# Patient Record
Sex: Female | Born: 1975 | Hispanic: Yes | Smoking: Current every day smoker
Health system: Southern US, Community
[De-identification: ages and names within clinical notes are randomized; demographics above are authoritative.]

## PROBLEM LIST (undated history)

## (undated) DIAGNOSIS — F32A Depression, unspecified: Secondary | ICD-10-CM

## (undated) DIAGNOSIS — N939 Abnormal uterine and vaginal bleeding, unspecified: Secondary | ICD-10-CM

## (undated) DIAGNOSIS — R638 Other symptoms and signs concerning food and fluid intake: Secondary | ICD-10-CM

## (undated) DIAGNOSIS — N920 Excessive and frequent menstruation with regular cycle: Secondary | ICD-10-CM

## (undated) DIAGNOSIS — N889 Noninflammatory disorder of cervix uteri, unspecified: Secondary | ICD-10-CM

## (undated) DIAGNOSIS — A4902 Methicillin resistant Staphylococcus aureus infection, unspecified site: Secondary | ICD-10-CM

## (undated) DIAGNOSIS — F329 Major depressive disorder, single episode, unspecified: Secondary | ICD-10-CM

## (undated) DIAGNOSIS — M2550 Pain in unspecified joint: Secondary | ICD-10-CM

## (undated) DIAGNOSIS — F419 Anxiety disorder, unspecified: Secondary | ICD-10-CM

## (undated) DIAGNOSIS — S52209A Unspecified fracture of shaft of unspecified ulna, initial encounter for closed fracture: Secondary | ICD-10-CM

## (undated) DIAGNOSIS — F4310 Post-traumatic stress disorder, unspecified: Secondary | ICD-10-CM

## (undated) DIAGNOSIS — R7303 Prediabetes: Secondary | ICD-10-CM

## (undated) HISTORY — DX: Other symptoms and signs concerning food and fluid intake: R63.8

## (undated) HISTORY — DX: Depression, unspecified: F32.A

## (undated) HISTORY — DX: Prediabetes: R73.03

## (undated) HISTORY — DX: Major depressive disorder, single episode, unspecified: F32.9

## (undated) HISTORY — DX: Anxiety disorder, unspecified: F41.9

## (undated) HISTORY — DX: Unspecified fracture of shaft of unspecified ulna, initial encounter for closed fracture: S52.209A

## (undated) HISTORY — DX: Noninflammatory disorder of cervix uteri, unspecified: N88.9

## (undated) HISTORY — DX: Pain in unspecified joint: M25.50

## (undated) HISTORY — DX: Methicillin resistant Staphylococcus aureus infection, unspecified site: A49.02

## (undated) HISTORY — DX: Post-traumatic stress disorder, unspecified: F43.10

## (undated) HISTORY — DX: Abnormal uterine and vaginal bleeding, unspecified: N93.9

## (undated) HISTORY — DX: Excessive and frequent menstruation with regular cycle: N92.0

---

## 1997-02-20 HISTORY — PX: CYST EXCISION: SHX5701

## 2000-01-20 ENCOUNTER — Inpatient Hospital Stay (HOSPITAL_COMMUNITY): Admission: AD | Admit: 2000-01-20 | Discharge: 2000-01-20 | Payer: Self-pay | Admitting: Obstetrics & Gynecology

## 2004-02-21 HISTORY — PX: CHOLECYSTECTOMY: SHX55

## 2004-08-04 ENCOUNTER — Ambulatory Visit: Payer: Self-pay | Admitting: Family Medicine

## 2004-08-11 ENCOUNTER — Ambulatory Visit: Payer: Self-pay | Admitting: Internal Medicine

## 2004-08-20 ENCOUNTER — Ambulatory Visit: Payer: Self-pay | Admitting: Internal Medicine

## 2004-09-26 ENCOUNTER — Inpatient Hospital Stay: Payer: Self-pay | Admitting: General Surgery

## 2005-02-25 ENCOUNTER — Emergency Department: Payer: Self-pay | Admitting: Emergency Medicine

## 2005-02-27 ENCOUNTER — Ambulatory Visit: Payer: Self-pay | Admitting: Internal Medicine

## 2005-03-23 ENCOUNTER — Ambulatory Visit: Payer: Self-pay | Admitting: Internal Medicine

## 2009-05-07 ENCOUNTER — Emergency Department (HOSPITAL_COMMUNITY): Admission: EM | Admit: 2009-05-07 | Discharge: 2009-05-07 | Payer: Self-pay | Admitting: Family Medicine

## 2009-05-21 ENCOUNTER — Emergency Department (HOSPITAL_COMMUNITY): Admission: EM | Admit: 2009-05-21 | Discharge: 2009-05-21 | Payer: Self-pay | Admitting: Family Medicine

## 2010-09-24 ENCOUNTER — Inpatient Hospital Stay (INDEPENDENT_AMBULATORY_CARE_PROVIDER_SITE_OTHER)
Admission: RE | Admit: 2010-09-24 | Discharge: 2010-09-24 | Disposition: A | Discharge status: Home / Self Care | Payer: BC Managed Care – PPO | Source: Ambulatory Visit | Attending: Emergency Medicine | Admitting: Emergency Medicine

## 2010-09-24 DIAGNOSIS — J029 Acute pharyngitis, unspecified: Secondary | ICD-10-CM

## 2010-09-24 LAB — POCT RAPID STREP A: Streptococcus, Group A Screen (Direct): NEGATIVE

## 2010-09-25 LAB — STREP A DNA PROBE: Group A Strep Probe: NEGATIVE

## 2011-03-24 DIAGNOSIS — 419620001 Death: Secondary | SNOMED CT

## 2011-03-24 DEATH — deceased

## 2011-05-09 DIAGNOSIS — O24419 Gestational diabetes mellitus in pregnancy, unspecified control: Secondary | ICD-10-CM | POA: Insufficient documentation

## 2013-02-20 HISTORY — PX: BREAST CYST ASPIRATION: SHX578

## 2013-11-20 DIAGNOSIS — A4902 Methicillin resistant Staphylococcus aureus infection, unspecified site: Secondary | ICD-10-CM

## 2013-11-20 HISTORY — DX: Methicillin resistant Staphylococcus aureus infection, unspecified site: A49.02

## 2013-11-20 LAB — HM MAMMOGRAPHY

## 2013-12-03 ENCOUNTER — Encounter: Payer: Self-pay | Admitting: General Surgery

## 2013-12-03 ENCOUNTER — Ambulatory Visit: Payer: Self-pay

## 2013-12-09 ENCOUNTER — Encounter: Payer: Self-pay | Admitting: General Surgery

## 2013-12-09 ENCOUNTER — Ambulatory Visit (INDEPENDENT_AMBULATORY_CARE_PROVIDER_SITE_OTHER): Payer: Managed Care, Other (non HMO) | Admitting: General Surgery

## 2013-12-09 VITALS — BP 130/84 | HR 74 | Resp 14 | Ht 66.0 in | Wt 242.0 lb

## 2013-12-09 DIAGNOSIS — L02412 Cutaneous abscess of left axilla: Secondary | ICD-10-CM

## 2013-12-09 MED ORDER — DOXYCYCLINE HYCLATE 100 MG PO CAPS: 100.0000 mg | ORAL_CAPSULE | Freq: Two times a day (BID) | ORAL | Status: DC

## 2013-12-09 NOTE — Patient Instructions (Signed)
The patient is aware to call back for any questions or concerns.  

## 2013-12-09 NOTE — Progress Notes (Signed)
Patient ID: Dana Durham, female   DOB: 03-03-1975, 38 y.o.   MRN: [id number]  Chief Complaint  Patient presents with  . Other    lump under left arm pit    HPI Dana Livoni C Leclaire is a 38 y.o. female.  Here for evaluation of a knot under her left axilla. She states it has been there about a month. It seems to have gotten larger in size. She noticed it while shaving. Denies any pain. No antibiotic therapy. She states it did drain an small amount of red drainage for a few days but is not draining now. Denies any breast issues. Denies any upper respiratory issues. 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   HPI  Past Medical History  Diagnosis Date  . Joint pain   . Depression   . PTSD (post-traumatic stress disorder)   . Cervix abnormality     Past Surgical History  Procedure Laterality Date  . Cyst excision  1999    eyelid  . Cholecystectomy  2006    Family History  Problem Relation Age of Onset  . Cancer Maternal Aunt     breast/Aunt  . Cancer Cousin     breast/Maternal    Social History History  Substance Use Topics  . Smoking status: Current Every Day Smoker -- 1.00 packs/day for 19 years  . Smokeless tobacco: Never Used  . Alcohol Use: No    Allergies  Allergen Reactions  . Shellfish Allergy Anaphylaxis    Current Outpatient Prescriptions  Medication Sig Dispense Refill  . ALPRAZolam (XANAX) 1 MG tablet Take 1 mg by mouth 3 (three) times daily as needed for anxiety.      . Multiple Vitamin (MULTIVITAMIN) capsule Take 1 capsule by mouth daily.      . Omega-3 Fatty Acids (FISH OIL) 600 MG CAPS Take by mouth daily.      Marland Kitchen. doxycycline (VIBRAMYCIN) 100 MG capsule Take 1 capsule (100 mg total) by mouth 2 (two) times daily.  30 capsule  2   No current facility-administered medications for this visit.    Review of Systems Review of Systems  Constitutional: Negative.    Respiratory: Negative.   Cardiovascular: Negative.     Blood pressure 130/84, pulse 74, resp. rate 14, height 5\' 6"  (1.676 m), weight 242 lb (109.77 kg), last menstrual period 11/22/2013.  Physical Exam Physical Exam  Constitutional: She is oriented to person, place, and time. She appears well-developed and well-nourished.  Neck: Neck supple.  Cardiovascular: Normal rate and regular rhythm.   Murmur heard.  Systolic murmur is present with a grade of 1/6  Pulmonary/Chest: Effort normal and breath sounds normal.    Breasts exam was unremarkable bilaterally.  Lymphadenopathy:    She has no cervical adenopathy.    She has axillary adenopathy.  Right axilla WNL. Left axilla with a 3 x 5 area of thick 1 x 3 area of fluctuant.  Neurological: She is alert and oriented to person, place, and time.  Skin: Skin is warm and dry.    Data Reviewe  Bilateral mammogram and left breast/axillary ultrasound dated December 03, 2013 was reviewed. Mammograms were unremarkable.  Focal area in the axilla showed a suspected fluid collection with extensive surrounding hypervascularity and edematous tissue. Normal appearing lymph node was noted. Impression was possible abscess. BI-RAD-3.  Assessment    Left axillary abscess, suspected variant of hidradenitis.  Plan    The patient was amenable to incision and drainage. 10 cc of 0.5% Xylocaine with 0.25% Marcaine with one 200,000 epinephrine was utilized well-tolerated. ChloraPrep was applied to the skin. An elliptical incision 2 cm in length was carried out and approximately 3-5 cc of watery purulent material obtained. Culture was sent for aerobic organisms. The underlying thickened tissue was exquisitely sensitive to touch and a biopsy was not possible. Dry dressing applied. Postoperative wound instructions reviewed with the patient by both myself and the nurse.  We'll initiate doxycycline 100 mg p.o. B.i.d. Pending culture results.     Follow up  in 2 weeks. Doxycycline 100 mg BID #30 with 2 refills Dressing care as needed.   PCP none Ref Coralee RudAnne Shaver RN BCCCP  Earline MayotteByrnett, [place] W 12/09/2013, 10:02 PM

## 2013-12-13 LAB — ANAEROBIC AND AEROBIC CULTURE

## 2013-12-22 ENCOUNTER — Ambulatory Visit: Payer: Managed Care, Other (non HMO) | Admitting: General Surgery

## 2013-12-22 ENCOUNTER — Encounter: Payer: Self-pay | Admitting: General Surgery

## 2013-12-23 ENCOUNTER — Telehealth: Payer: Self-pay

## 2013-12-23 NOTE — Telephone Encounter (Signed)
The patient was calling to ask about the results of her culture.

## 2013-12-23 NOTE — Telephone Encounter (Signed)
Culture report reviewed. She states the area has much improved. No drainage. She is still on ATB. She will call back to make the Follow up appointment.

## 2014-01-08 ENCOUNTER — Encounter: Payer: Self-pay | Admitting: *Deleted

## 2014-02-20 LAB — HM PAP SMEAR

## 2014-04-08 DIAGNOSIS — N939 Abnormal uterine and vaginal bleeding, unspecified: Secondary | ICD-10-CM

## 2014-04-08 DIAGNOSIS — N920 Excessive and frequent menstruation with regular cycle: Secondary | ICD-10-CM

## 2014-04-08 HISTORY — DX: Excessive and frequent menstruation with regular cycle: N92.0

## 2014-04-08 HISTORY — DX: Abnormal uterine and vaginal bleeding, unspecified: N93.9

## 2014-06-23 ENCOUNTER — Other Ambulatory Visit: Payer: Self-pay | Admitting: *Deleted

## 2014-06-23 DIAGNOSIS — N63 Unspecified lump in unspecified breast: Secondary | ICD-10-CM

## 2014-07-09 ENCOUNTER — Telehealth: Payer: Self-pay | Admitting: *Deleted

## 2014-07-09 NOTE — Telephone Encounter (Signed)
Called to f/u with patient to ensure she has her next mammogram appointment.  States she is scheduled for Monday, May 23rd.  If she does not have a f/u appointment with Dr. Lemar LivingsByrnett we will assist with helping make that appointment.  She is to call me back after her mammogram for any needed assistance.

## 2014-07-13 ENCOUNTER — Ambulatory Visit
Admission: RE | Admit: 2014-07-13 | Discharge: 2014-07-13 | Disposition: A | Discharge status: Home / Self Care | Payer: PRIVATE HEALTH INSURANCE | Source: Ambulatory Visit | Attending: Oncology | Admitting: Oncology

## 2014-07-13 ENCOUNTER — Ambulatory Visit: Payer: PRIVATE HEALTH INSURANCE

## 2014-07-13 ENCOUNTER — Other Ambulatory Visit: Payer: Self-pay | Admitting: *Deleted

## 2014-07-13 DIAGNOSIS — N63 Unspecified lump in unspecified breast: Secondary | ICD-10-CM

## 2014-07-13 DIAGNOSIS — R928 Other abnormal and inconclusive findings on diagnostic imaging of breast: Secondary | ICD-10-CM

## 2014-07-13 DIAGNOSIS — R2232 Localized swelling, mass and lump, left upper limb: Secondary | ICD-10-CM

## 2014-07-15 ENCOUNTER — Encounter: Payer: Self-pay | Admitting: *Deleted

## 2014-08-06 ENCOUNTER — Ambulatory Visit: Payer: Self-pay | Admitting: Obstetrics and Gynecology

## 2014-10-22 ENCOUNTER — Encounter: Payer: Self-pay | Admitting: Obstetrics and Gynecology

## 2014-10-22 ENCOUNTER — Ambulatory Visit (INDEPENDENT_AMBULATORY_CARE_PROVIDER_SITE_OTHER): Payer: PRIVATE HEALTH INSURANCE | Admitting: Obstetrics and Gynecology

## 2014-10-22 VITALS — BP 133/81 | HR 103 | Ht 66.0 in | Wt 240.1 lb

## 2014-10-22 DIAGNOSIS — E669 Obesity, unspecified: Secondary | ICD-10-CM | POA: Diagnosis not present

## 2014-10-22 DIAGNOSIS — R5383 Other fatigue: Secondary | ICD-10-CM | POA: Diagnosis not present

## 2014-10-22 DIAGNOSIS — R7303 Prediabetes: Secondary | ICD-10-CM

## 2014-10-22 DIAGNOSIS — N939 Abnormal uterine and vaginal bleeding, unspecified: Secondary | ICD-10-CM | POA: Diagnosis not present

## 2014-10-22 DIAGNOSIS — R7309 Other abnormal glucose: Secondary | ICD-10-CM | POA: Diagnosis not present

## 2014-10-22 NOTE — Progress Notes (Signed)
Patient ID: Dana Durham, female   DOB: 1975-07-27, 39 y.o.   MRN: [id number]   Chief complaint: 1.  Abnormal uterine bleeding.  Patient presents for evaluation of abnormal uterine bleeding. Pt having spotting this last period which should have been a normal flow. Lmp:09/21/2014. Mini pill helped with bleeding but is no longer taking it. Took x1 week. Wants to wait and do HgbA1c in about a month.  Endometrial biopsy in February 2016 was normal. Patient needs repeat hemoglobin A1c; however, she does not want to get it drawn at this time because she realizes she has not been dilating appropriately, and exercising.  She would like to repeat it in 3 months.  Patient does have fatigue and is on B12.  Past medical history, past surgical history, problem list, medications, and allergies have been reviewed and updated.  OBJECTIVE: BP 133/81 mmHg  Pulse 103  Ht  (1.676 m)  Wt 240 lb 2 oz (108.92 kg)  BMI 38.78 kg/m2  LMP 09/21/2014 (Approximate)   IMPRESSION: 1.  Abnormal uterine bleeding on minipill. 2.  Endometrial biopsy 03/2014.  Benign. 3.  Obesity. 4.  Fatigue.  PLAN: 1.  Ultrasound of pelvis. 2.  Menstrual calendar, monitoring 3.  Return to healthy eating and exercise. 4.  Return in 3 months for follow-up. 5.  Hemoglobin A1c in 3 months.  A total of 15 minutes were spent face-to-face with the patient during this encounter and over half of that time dealt with counseling and coordination of care.

## 2014-10-26 DIAGNOSIS — N939 Abnormal uterine and vaginal bleeding, unspecified: Secondary | ICD-10-CM | POA: Insufficient documentation

## 2014-10-26 DIAGNOSIS — R5383 Other fatigue: Secondary | ICD-10-CM | POA: Insufficient documentation

## 2014-10-26 DIAGNOSIS — E669 Obesity, unspecified: Secondary | ICD-10-CM | POA: Insufficient documentation

## 2014-10-26 NOTE — Patient Instructions (Signed)
1.  Ultrasound of pelvis ordered. 2.  Monitor cycles with menstrual calendar. 3.  Resume pelvic eating and exercise. 4.  Return in 3 months for follow-up and hemoglobin A1c

## 2014-10-30 ENCOUNTER — Other Ambulatory Visit: Payer: PRIVATE HEALTH INSURANCE

## 2014-10-30 ENCOUNTER — Ambulatory Visit: Payer: PRIVATE HEALTH INSURANCE

## 2014-10-30 DIAGNOSIS — R7303 Prediabetes: Secondary | ICD-10-CM

## 2014-10-30 DIAGNOSIS — N939 Abnormal uterine and vaginal bleeding, unspecified: Secondary | ICD-10-CM

## 2014-10-30 DIAGNOSIS — R7309 Other abnormal glucose: Secondary | ICD-10-CM

## 2014-11-03 ENCOUNTER — Telehealth: Payer: Self-pay | Admitting: Obstetrics and Gynecology

## 2014-11-03 NOTE — Telephone Encounter (Signed)
Adanely CALLED FOR HER [place] RESULTS

## 2014-11-04 ENCOUNTER — Telehealth: Payer: Self-pay | Admitting: Obstetrics and Gynecology

## 2014-11-04 NOTE — Telephone Encounter (Signed)
CALLED AND WANTED TO KNOW THE RESULTS OF HER [place] THAT SHE HAD LAST WEEK.

## 2014-11-04 NOTE — Telephone Encounter (Signed)
Pt aware u/s neg. See previous note.

## 2014-11-04 NOTE — Telephone Encounter (Signed)
Pt aware. U/s neg.

## 2015-01-21 ENCOUNTER — Ambulatory Visit: Payer: PRIVATE HEALTH INSURANCE | Admitting: Obstetrics and Gynecology

## 2015-03-25 ENCOUNTER — Ambulatory Visit (INDEPENDENT_AMBULATORY_CARE_PROVIDER_SITE_OTHER): Payer: PRIVATE HEALTH INSURANCE | Admitting: Obstetrics and Gynecology

## 2015-03-25 ENCOUNTER — Encounter: Payer: Self-pay | Admitting: Obstetrics and Gynecology

## 2015-03-25 VITALS — BP 118/82 | HR 87 | Wt 270.1 lb

## 2015-03-25 DIAGNOSIS — S52209A Unspecified fracture of shaft of unspecified ulna, initial encounter for closed fracture: Secondary | ICD-10-CM | POA: Insufficient documentation

## 2015-03-25 DIAGNOSIS — R030 Elevated blood-pressure reading, without diagnosis of hypertension: Secondary | ICD-10-CM

## 2015-03-25 DIAGNOSIS — S52202B Unspecified fracture of shaft of left ulna, initial encounter for open fracture type I or II: Secondary | ICD-10-CM

## 2015-03-25 DIAGNOSIS — R635 Abnormal weight gain: Secondary | ICD-10-CM

## 2015-03-25 DIAGNOSIS — N939 Abnormal uterine and vaginal bleeding, unspecified: Secondary | ICD-10-CM

## 2015-03-25 DIAGNOSIS — E669 Obesity, unspecified: Secondary | ICD-10-CM

## 2015-03-25 DIAGNOSIS — I10 Essential (primary) hypertension: Secondary | ICD-10-CM

## 2015-03-25 DIAGNOSIS — IMO0001 Reserved for inherently not codable concepts without codable children: Secondary | ICD-10-CM

## 2015-03-25 NOTE — Progress Notes (Signed)
Patient ID: Dana Durham, female   DOB: 1976/01/26, 40 y.o.   MRN: [id number] Pt had no menses in January 2017. LMP: 01/27/2015. Took home UPT-NEG. On 03/20/2015.  [name], MD

## 2015-03-25 NOTE — Progress Notes (Signed)
GYN ENCOUNTER NOTE  Subjective:       Dana Durham is a 40 y.o. [phone] female is here for gynecologic evaluation of the following issues:  1. Irregular menses.     40 yo [id number] female presents for irregular menses. Over one year ago the patient had regular menses followed by intermittent spotting between periods. This then progressed to constant bleeding. She was given progesterone at that time which stopped her menses. Her cycles became regular again in September, 2016 and remained regular until January. Her LMP was 01/27/15. She had a negative UPT on 03/20/15. She lost 60lbs around August and gained 30lbs over the past few months. The patient is considering trying to get pregnant in the next 1-2 years. Denies recent illnesses, urinary, bowel and cardiac complaints. History of smoking 1ppd for 19 years.   Gynecologic History No LMP recorded. Contraception: None Last Pap: 01/27/16 wnl Last mammogram: 07/13/14 small benign nodules  Obstetric History OB History  Gravida Para Term Preterm AB SAB TAB Ectopic Multiple Living  # Outcome Date GA Lbr Len/2nd Weight Sex Delivery Anes PTL Lv  5 SAB           4 SAB           3 SAB           2 SAB           1 Para             Obstetric Comments  1st Menstrual Cycle:  15   1st Pregnancy:  19    Past Medical History  Diagnosis Date  . Joint pain   . Depression   . PTSD (post-traumatic stress disorder)   . Cervix abnormality   . Increased BMI   . Abnormal uterine bleeding 04/08/2014    endometrial bx- normal w/secrectoy tissue and no hyperplasia or carcinoma  . Heavy periods 04/08/2014    start minipill for regulation of period. advise against estrogen at this time due to increased age and diastolic htn  . Anxiety and depression   . MRSA (methicillin resistant Staphylococcus aureus) 11/2013    left arm  . Prediabetes   . Fracture of ulna     left    Past Surgical History  Procedure Laterality Date  . Cyst excision   1999    eyelid  . Cholecystectomy  2006  . Breast cyst aspiration Left 2015    lt axillary drainage    Current Outpatient Prescriptions on File Prior to Visit  Medication Sig Dispense Refill  . ALPRAZolam (XANAX) 1 MG tablet Take 1 mg by mouth 3 (three) times daily as needed for anxiety.    . Biotin 1 MG CAPS Take 1 tablet by mouth daily.    . ferrous sulfate 325 (65 FE) MG tablet Take 325 mg by mouth daily with breakfast.    . Multiple Vitamin (MULTIVITAMIN) capsule Take 1 capsule by mouth daily.    . Omega-3 Fatty Acids (FISH OIL) 600 MG CAPS Take by mouth daily.    . Vitamin D, Cholecalciferol, 1000 UNITS CAPS Take 1 capsule by mouth daily.     No current facility-administered medications on file prior to visit.    Allergies  Allergen Reactions  . Shellfish Allergy Anaphylaxis    Social History   Social History  . Marital Status: Married    Spouse Name: N/A  . Number of Children: N/A  .  Years of Education: N/A   Occupational History  . Not on file.   Social History Main Topics  . Smoking status: Current Every Day Smoker -- 1.00 packs/day for 19 years  . Smokeless tobacco: Never Used  . Alcohol Use: No  . Drug Use: No  . Sexual Activity: Not on file   Other Topics Concern  . Not on file   Social History Narrative    Family History  Problem Relation Age of Onset  . Breast cancer Maternal Aunt   . Cancer Maternal Aunt     breast/Aunt  . Cancer Cousin     breast/Maternal    The following portions of the patient's history were reviewed and updated as appropriate: allergies, current medications, past family history, past medical history, past social history, past surgical history and problem list.  Review of Systems Review of Systems - General ROS: negative for - chills, fever Gastrointestinal ROS: negative for - change in bowel habits and nausea/vomiting Genito-Urinary ROS: negative for - dysuria, incontinence, urinary urgency, frequency  Objective:    BP 118/82 mmHg  Pulse 87  Wt 270 lb 1 oz (122.5 kg) CONSTITUTIONAL: Well-developed, well-nourished, obese female in no acute distress.  HENT:  Normocephalic, atraumatic.  NEUROLGIC: Alert and oriented to person, place, and time.  PSYCHIATRIC: Normal mood and affect. Normal behavior. Normal judgment and thought content. Some depression due to loss of child in 2013 CARDIOVASCULAR: Normal s1/s2, no m/r/g RESPIRATORY: CTAB BREASTS: Not Examined ABDOMEN: Not examined PELVIC: Not examined    Assessment:   1. Fracture of ulna, left, open type I or II, initial encounter  2. Obesity - Hemoglobin A1c  3. Weight gain - TSH  4. Essential hypertension - CBC with Differential/Platelet - Basic Metabolic Panel (BMET)  5. Elevated blood pressure    Plan:   Given the patient's BMI of 38, elevated blood pressure, and history of smoking, the patient is not a candidate for OCP due to increased risk of MI and CVA. The patient is not a good candidate or LARC since she is considering pregnancy in the next 1-2 years. She also decided against birth control that might cause her menses to be irregular. She will have her blood drawn for TSH, Hgb A1c, CBC, and BMP to further evaluate her elevated blood pressure and weight gain. Prenatal concerns were discussed, including cessation of smoking, weight loss, and controlling A1c levels. 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    Dana Lynn PA-S Dana Harms, MD   I have seen, interviewed, and examined the patient in conjunction with the [employer] student and affirm the diagnosis and management plan. Dana Durham A. Dana Kjos, MD, FACOG   Note: This dictation was prepared with Dragon dictation along with smaller phrase technology. Any transcriptional errors that result from this process are unintentional.

## 2015-03-25 NOTE — Patient Instructions (Signed)
1.  Menstrual calendar, monitoring 2.  Hemoglobin A1c, TSH, and, basic metabolic panel blood work is obtained today. 3.  Check blood pressure at hospice daily for the next 2 weeks. 4.  Return in 2 weeks for follow-up on blood pressure and lab work. 5.  Work on healthy eating and exercise in order to lose 1 pound per month. 6.  Strongly encouraged her smoking cessation

## 2015-03-26 ENCOUNTER — Telehealth: Payer: Self-pay

## 2015-03-26 DIAGNOSIS — E119 Type 2 diabetes mellitus without complications: Secondary | ICD-10-CM

## 2015-03-26 LAB — CBC WITH DIFFERENTIAL/PLATELET
BASOS: 1 %
Basophils Absolute: 0.1 10*3/uL (ref 0.0–0.2)
EOS (ABSOLUTE): 0.5 10*3/uL — ABNORMAL HIGH (ref 0.0–0.4)
Eos: 5 %
HEMOGLOBIN: 15.5 g/dL (ref 11.1–15.9)
Hematocrit: 46.6 % (ref 34.0–46.6)
IMMATURE GRANS (ABS): 0 10*3/uL (ref 0.0–0.1)
IMMATURE GRANULOCYTES: 0 %
LYMPHS: 33 %
Lymphocytes Absolute: 3.2 10*3/uL — ABNORMAL HIGH (ref 0.7–3.1)
MCH: 28.1 pg (ref 26.6–33.0)
MCHC: 33.3 g/dL (ref 31.5–35.7)
MCV: 84 fL (ref 79–97)
MONOCYTES: 4 %
Monocytes Absolute: 0.4 10*3/uL (ref 0.1–0.9)
NEUTROS ABS: 5.4 10*3/uL (ref 1.4–7.0)
Neutrophils: 57 %
Platelets: 369 10*3/uL (ref 150–379)
RBC: 5.52 x10E6/uL — ABNORMAL HIGH (ref 3.77–5.28)
RDW: 14.7 % (ref 12.3–15.4)
WBC: 9.5 10*3/uL (ref 3.4–10.8)

## 2015-03-26 LAB — BASIC METABOLIC PANEL
BUN / CREAT RATIO: 10 (ref 8–20)
BUN: 8 mg/dL (ref 6–20)
CO2: 18 mmol/L (ref 18–29)
CREATININE: 0.8 mg/dL (ref 0.57–1.00)
Calcium: 9.6 mg/dL (ref 8.7–10.2)
Chloride: 101 mmol/L (ref 96–106)
GFR calc Af Amer: 109 mL/min/{1.73_m2} (ref >59)
GFR calc non Af Amer: 94 mL/min/{1.73_m2} (ref >59)
GLUCOSE: 204 mg/dL — AB (ref 65–99)
Potassium: 4.5 mmol/L (ref 3.5–5.2)
SODIUM: 137 mmol/L (ref 134–144)

## 2015-03-26 LAB — TSH: TSH: 1.18 u[IU]/mL (ref 0.450–4.500)

## 2015-03-26 LAB — HEMOGLOBIN A1C
Est. average glucose Bld gHb Est-mCnc: 180 mg/dL
Hgb A1c MFr Bld: 7.9 % — ABNORMAL HIGH (ref 4.8–5.6)

## 2015-03-26 MED ORDER — METFORMIN HCL 500 MG PO TABS: 500.0000 mg | ORAL_TABLET | Freq: Two times a day (BID) | ORAL | Status: DC

## 2015-03-26 NOTE — Telephone Encounter (Signed)
-----   Message from [name], MD sent at 03/26/2015 10:08 AM EST ----- Please notify - Abnormal Labs Hemoglobin A1C is consistent with DM; Start on Metformin 500 mg BID; Lifestyle center consult; Referral to endocrinology for F/U

## 2015-03-26 NOTE — Telephone Encounter (Signed)
Pt aware. Med erx. Pt requested a pcp instead of endocrinologist. Sent referral to [facility]- Dr lada.

## 2015-04-08 ENCOUNTER — Ambulatory Visit (INDEPENDENT_AMBULATORY_CARE_PROVIDER_SITE_OTHER): Payer: Managed Care, Other (non HMO) | Admitting: Obstetrics and Gynecology

## 2015-04-08 ENCOUNTER — Encounter: Payer: Self-pay | Admitting: Obstetrics and Gynecology

## 2015-04-08 VITALS — BP 153/107 | HR 106 | Wt 273.1 lb

## 2015-04-08 DIAGNOSIS — I10 Essential (primary) hypertension: Secondary | ICD-10-CM | POA: Diagnosis not present

## 2015-04-08 DIAGNOSIS — E669 Obesity, unspecified: Secondary | ICD-10-CM | POA: Diagnosis not present

## 2015-04-08 DIAGNOSIS — N939 Abnormal uterine and vaginal bleeding, unspecified: Secondary | ICD-10-CM | POA: Diagnosis not present

## 2015-04-08 DIAGNOSIS — R030 Elevated blood-pressure reading, without diagnosis of hypertension: Secondary | ICD-10-CM | POA: Diagnosis not present

## 2015-04-08 DIAGNOSIS — IMO0001 Reserved for inherently not codable concepts without codable children: Secondary | ICD-10-CM

## 2015-04-08 DIAGNOSIS — E119 Type 2 diabetes mellitus without complications: Secondary | ICD-10-CM | POA: Diagnosis not present

## 2015-04-08 NOTE — Progress Notes (Signed)
Chief complaint: 1. Diabetes mellitus type 2 2. Hypertension 3. Irregular menstrual cycles 4. Infertility  Patient presents for a conference to discuss the above issues. DIABETES MELLITUS TYPE II: Recent hemoglobin A1c demonstrated type 2 diabetes mellitus. She has been started on metformin 500 mg twice a day. She is scheduled to see counselors in the last center for dietary management and monitoring of blood sugars; this has not been completed as of this visit. She also was to be scheduled to see primary care at [facility]; this has not been established as well. HYPERTENSION: CBC and basic metabolic panel are normal. Blood pressure review from workplace (hospice) is notable for her blood sugars running anywhere from 120-140s over 70s to low 90s. IRREGULAR MENSTRUAL CYCLES: Last menses was 7 days long and heavy and painful. Patient understands that her irregular cycles are likely related to diabetes as well as weight and probable PCO. She has not OCP candidate for cycle regulation at this time. She is not interested in cyclic Provera at this time. INFERTILITY: Patient does want to conceive in the near future. She does understand that it is imperative for her to get her blood sugar, blood pressure, and weight under better control prior to conception.  OBJECTIVE: BP 153/107 mmHg  Pulse 106  Wt 273 lb 1 oz (123.86 kg)  LMP 04/02/2015  CBC Latest Ref Rng 03/25/2015  WBC 3.4 - 10.8 x10E3/uL 9.5  Hematocrit 34.0 - 46.6 % 46.6  Platelets 150 - 379 x10E3/uL 369   BMP Latest Ref Rng 03/25/2015  Glucose 65 - 99 mg/dL 161(W)  BUN 6 - 20 mg/dL 8  Creatinine 9.60 - 4.54 mg/dL 0.98  BUN/Creat Ratio 8 - 20 10  Sodium 134 - 144 mmol/L 137  Potassium 3.5 - 5.2 mmol/L 4.5  Chloride 96 - 106 mmol/L 101  CO2 18 - 29 mmol/L 18  Calcium 8.7 - 10.2 mg/dL 9.6   Hemoglobin J1B-1.4   IMPRESSION: 1. Diabetes mellitus type 2; recently started on metformin 2. Hypertension; white count syndrome 3. Irregular  menstrual cycles; likely related to diabetes mellitus and weight and probable PCO 4. 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 Continue metformin 500 mg twice a day 2. Follow through with lifestyle Center consultation 3. Primary care referral for establishment of care and management of hypertension, diabetes, weight 4. Continue with healthy eating and exercise with weight loss 5. Return in 3 months for follow-up and further management planning  A total of 15 minutes were spent face-to-face with the patient during this encounter and over half of that time dealt with counseling and coordination of care.  [name], MD  Note: This dictation was prepared with Dragon dictation along with smaller phrase technology. Any transcriptional errors that result from this process are unintentional.

## 2015-04-08 NOTE — Patient Instructions (Signed)
1. Continue metformin 500 mg twice a day 2. Follow through with lifestyle Center consultation 3. Primary care referral for establishment of care and management of hypertension, diabetes, weight 4. Continue with healthy eating and exercise with weight loss 5. Return in 3 months for follow-up and further management planning

## 2015-04-29 ENCOUNTER — Ambulatory Visit: Payer: PRIVATE HEALTH INSURANCE | Admitting: Nurse Practitioner

## 2015-07-06 ENCOUNTER — Ambulatory Visit: Payer: Managed Care, Other (non HMO) | Admitting: Obstetrics and Gynecology

## 2016-09-04 IMAGING — MG MM DIAG BREAST TOMO UNI RIGHT
6 series · 6 of 14 positions shown · non-contrast
Comparison: 12/03/2013.

CLINICAL DATA: Followup small asymmetry in the medial right breast
with no ultrasound correlate and followup probable reactive enlarged
left axillary lymph node. Status post surgical excision and drainage
of a MRSA abscess in the left axilla in the interim.

EXAM:
DIGITAL DIAGNOSTIC RIGHT MAMMOGRAM WITH 3D TOMOSYNTHESIS WITH CAD
ULTRASOUND LEFT BREAST

[R CC synth-2D]
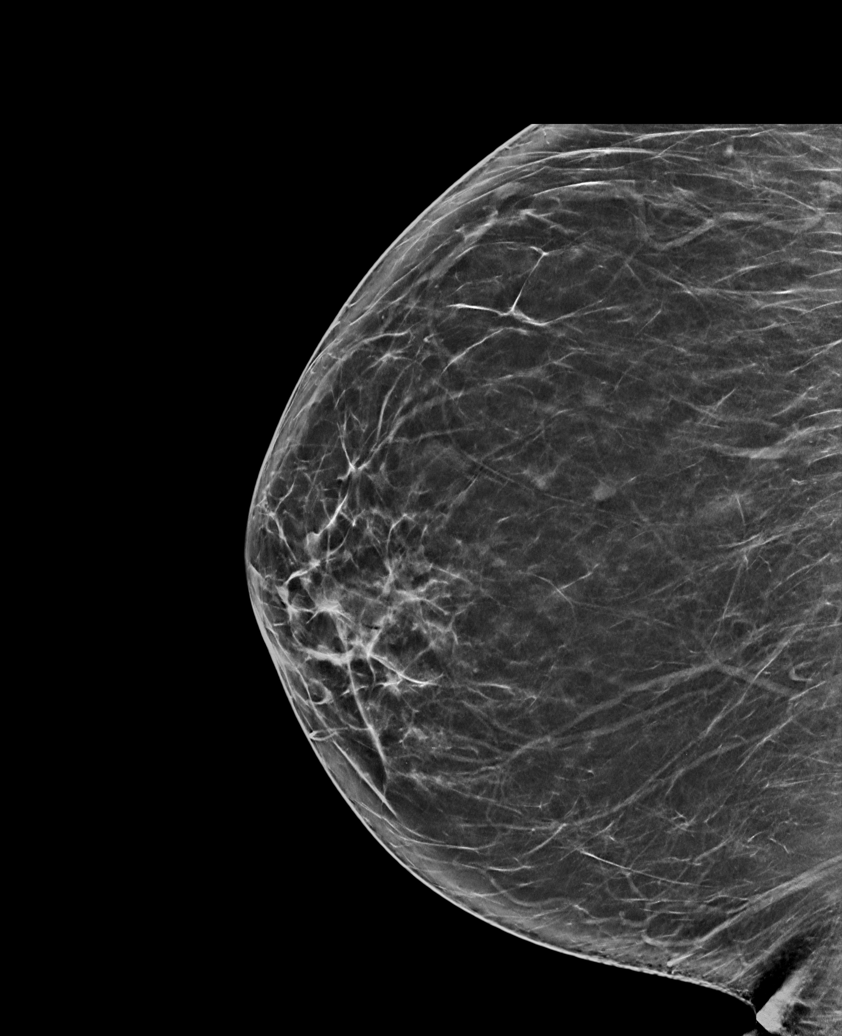

[R MLO synth-2D]
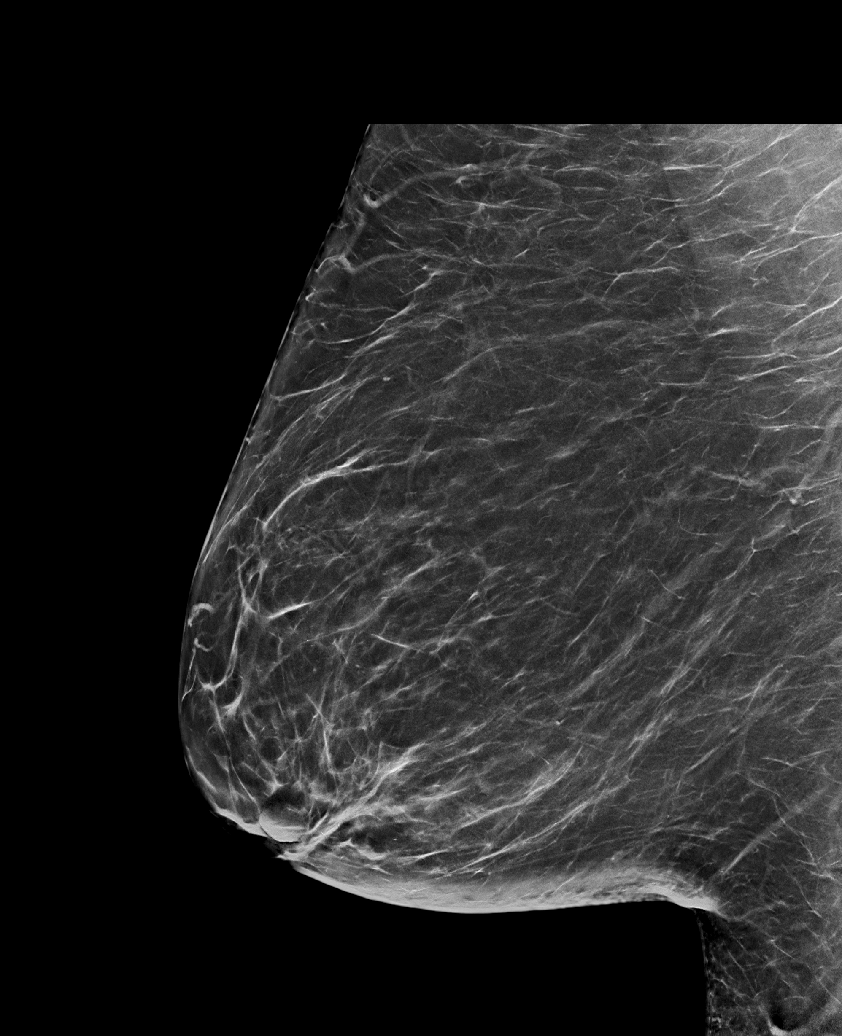

[R CC]
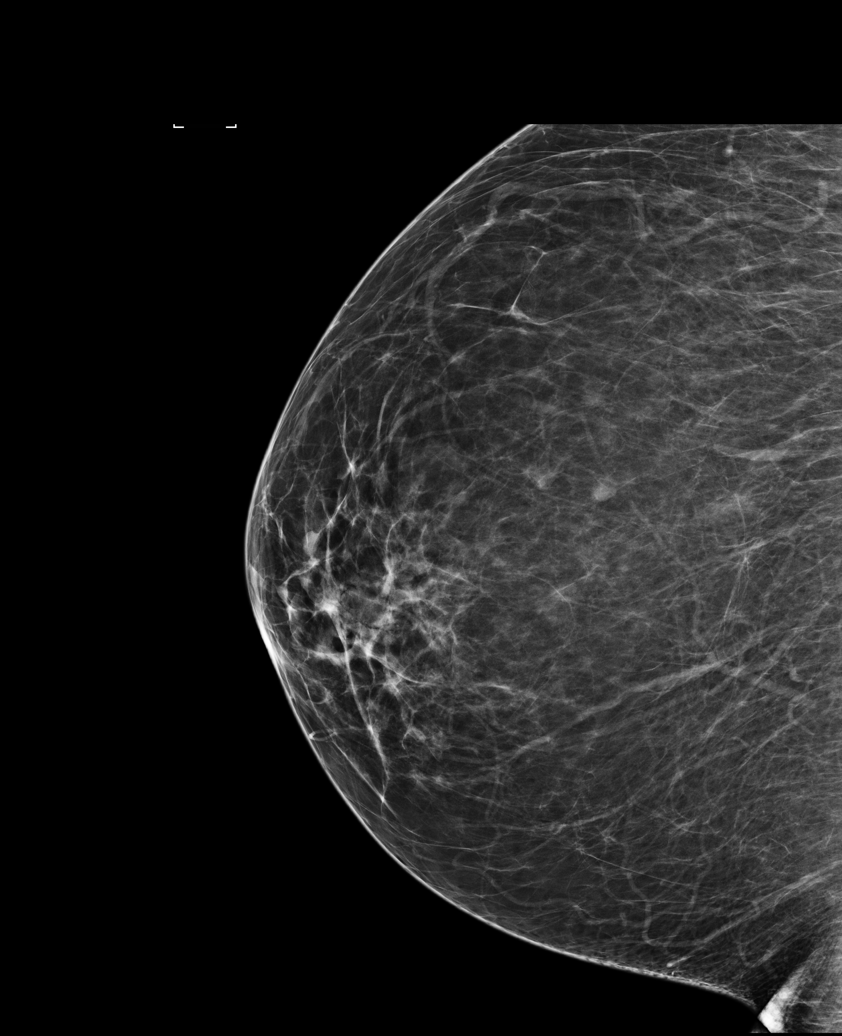

[R MLO]
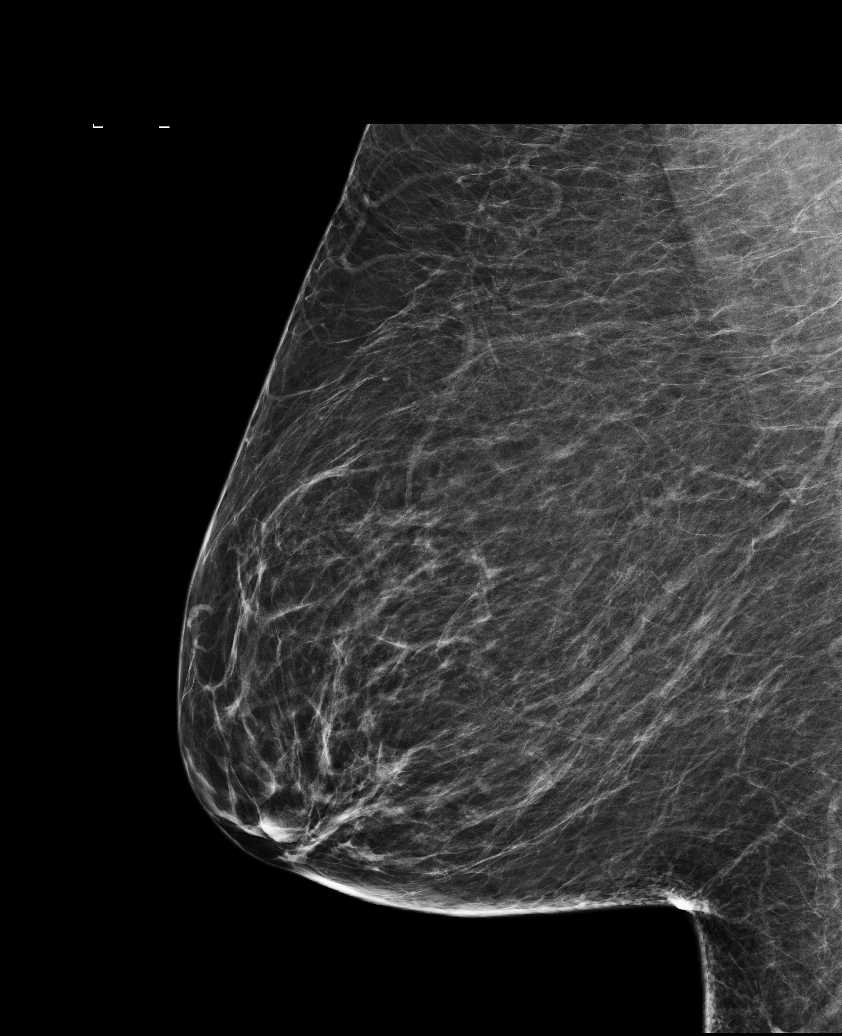

[R MLO tomo · tomo slice 46/91.0]
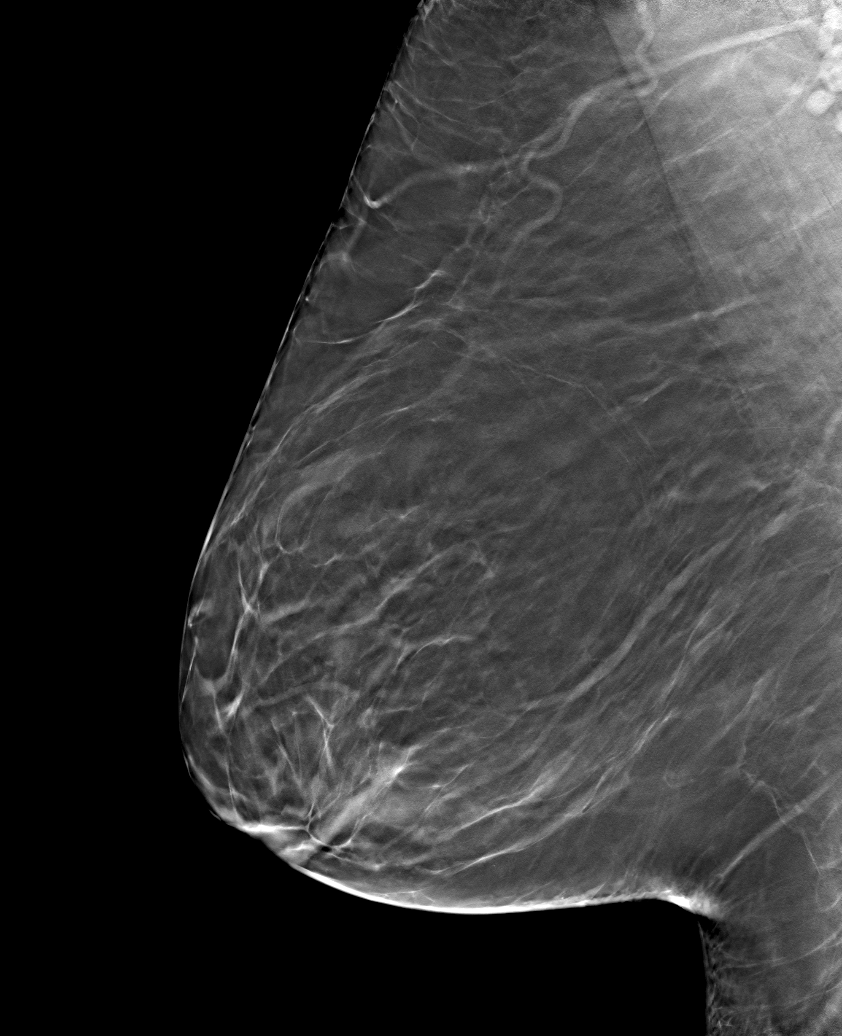

[R CC tomo · tomo slice 38/75.0]
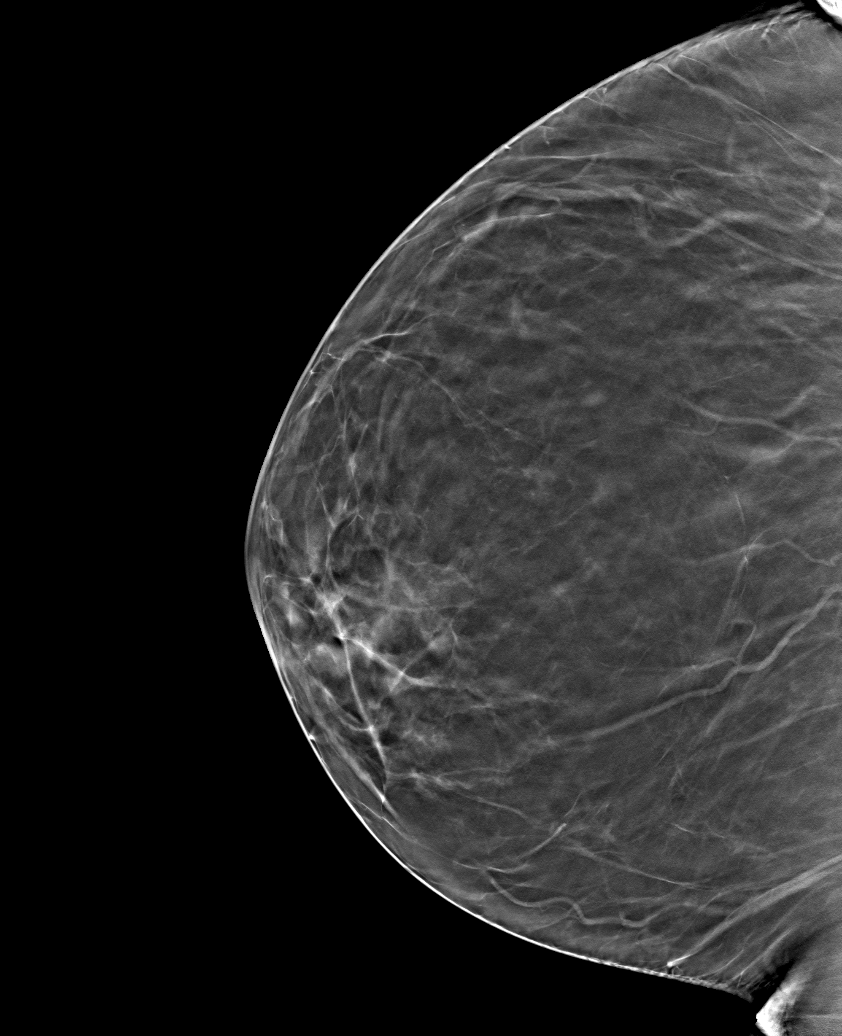

[6 of 14 positions shown; findings below may reference images not displayed]

ACR Breast Density Category b: There are scattered areas of
fibroglandular density.
FINDINGS: The previously seen small, oval asymmetry in the medial right breast
is unchanged on today's 2D images. On the 3D images, this is a
small, circumscribed, oval mass. There are 3 smaller, similar
appearing masses in that region of the breast.

Mammographic images were processed with CAD.

On physical exam, no mass is palpable in the left axilla.

Targeted ultrasound is performed, showing no residual left axillary
fluid collection. The previously demonstrated 10 x 9 x 7 mm oval,
homogeneously hypoechoic left axillary lymph node is significantly
smaller, currently measuring 8 x 5 x 4 mm. There is interval
development of a thin fatty hilum.
IMPRESSION: 1. Stable small, benign nodule in the medial right breast. There are
3 additional smaller, similar-appearing nodules on today's 3D
images. There were also several small similar-appearing nodules in
the anterior left breast on the previous mammogram. The multiplicity
is compatible with a benign process.
2. Improved appearance of a previously demonstrated reactive left
axillary lymph node. The interval decrease in size is compatible
with a benign process.

RECOMMENDATION:
Annual screening mammography beginning at age 40.

I have discussed the findings and recommendations with the patient.
Results were also provided in writing at the conclusion of the
visit. If applicable, a reminder letter will be sent to the patient
regarding the next appointment.

BI-RADS CATEGORY  2: Benign.

## 2016-09-04 IMAGING — US US BREAST COMPLETE UNI LEFT INC AXILLA
1 series · 3 of 3 positions shown · non-contrast
Comparison: 12/03/2013.

CLINICAL DATA: Followup small asymmetry in the medial right breast
with no ultrasound correlate and followup probable reactive enlarged
left axillary lymph node. Status post surgical excision and drainage
of a MRSA abscess in the left axilla in the interim.

EXAM:
DIGITAL DIAGNOSTIC RIGHT MAMMOGRAM WITH 3D TOMOSYNTHESIS WITH CAD
ULTRASOUND LEFT BREAST

[Series 1: us breast complete uni left inc axilla · 0.11mm/px · 3 of 3 slices shown]
[im 1/3]
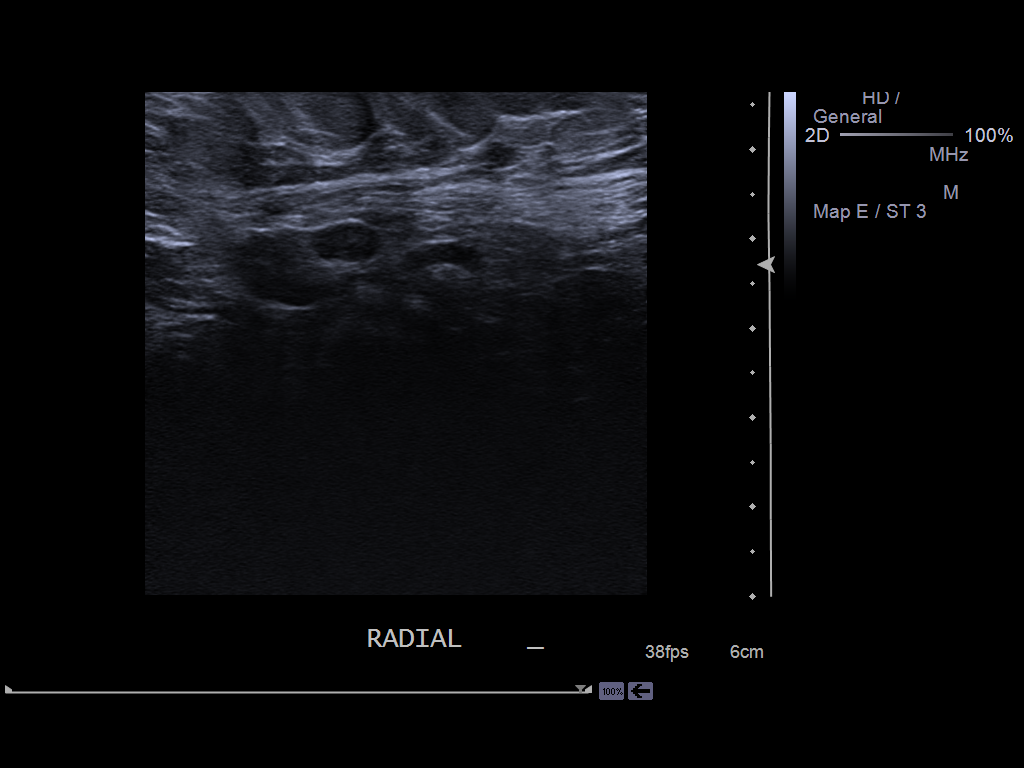
[im 2/3]
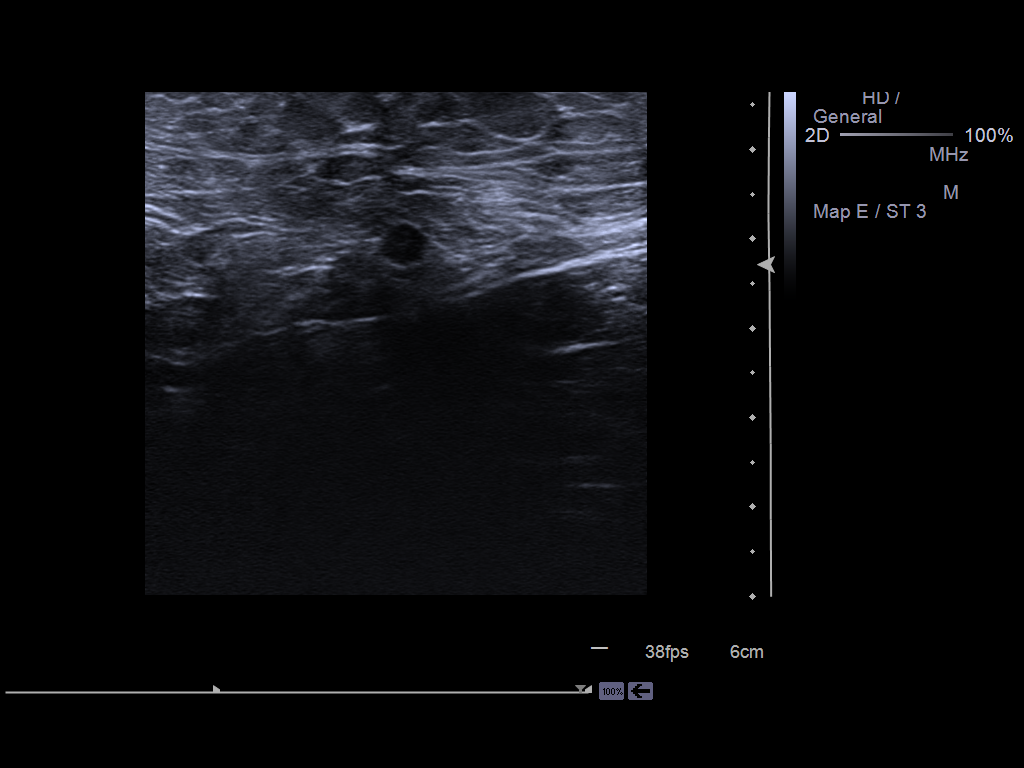
[im 3/3]
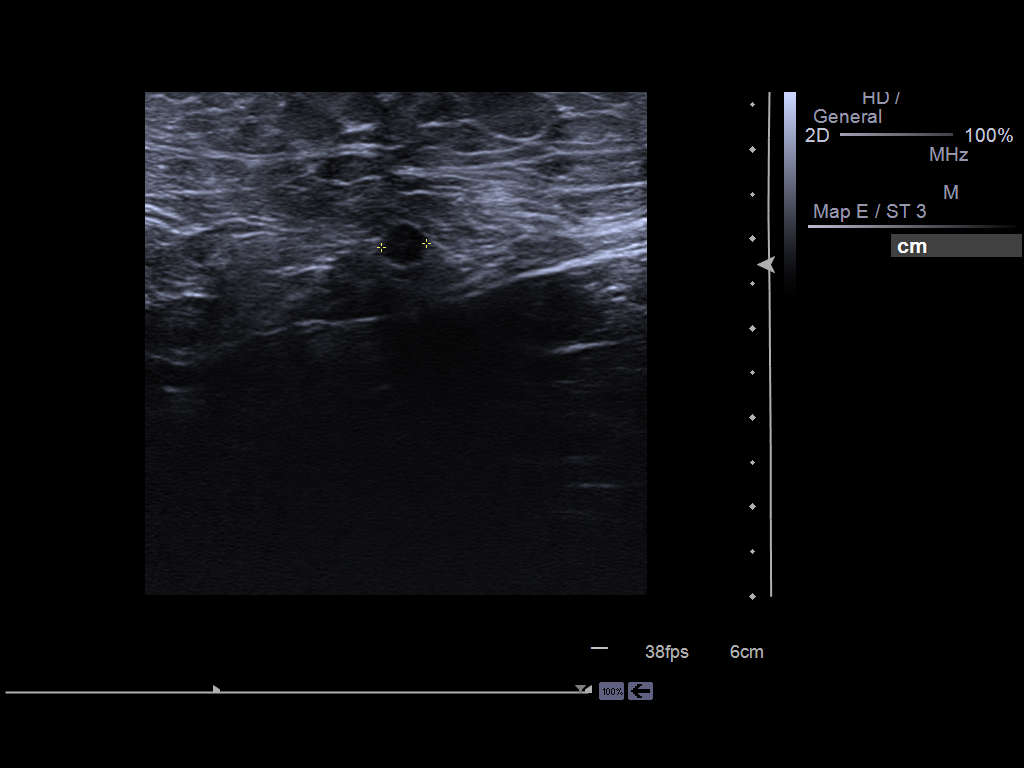

[3 of 3 positions shown; findings below may reference images not displayed]

ACR Breast Density Category b: There are scattered areas of
fibroglandular density.
FINDINGS: The previously seen small, oval asymmetry in the medial right breast
is unchanged on today's 2D images. On the 3D images, this is a
small, circumscribed, oval mass. There are 3 smaller, similar
appearing masses in that region of the breast.

Mammographic images were processed with CAD.

On physical exam, no mass is palpable in the left axilla.

Targeted ultrasound is performed, showing no residual left axillary
fluid collection. The previously demonstrated 10 x 9 x 7 mm oval,
homogeneously hypoechoic left axillary lymph node is significantly
smaller, currently measuring 8 x 5 x 4 mm. There is interval
development of a thin fatty hilum.
IMPRESSION: 1. Stable small, benign nodule in the medial right breast. There are
3 additional smaller, similar-appearing nodules on today's 3D
images. There were also several small similar-appearing nodules in
the anterior left breast on the previous mammogram. The multiplicity
is compatible with a benign process.
2. Improved appearance of a previously demonstrated reactive left
axillary lymph node. The interval decrease in size is compatible
with a benign process.

RECOMMENDATION:
Annual screening mammography beginning at age 40.

I have discussed the findings and recommendations with the patient.
Results were also provided in writing at the conclusion of the
visit. If applicable, a reminder letter will be sent to the patient
regarding the next appointment.

BI-RADS CATEGORY  2: Benign.

## 2017-04-10 ENCOUNTER — Encounter: Payer: Self-pay | Admitting: Obstetrics and Gynecology

## 2017-04-10 ENCOUNTER — Ambulatory Visit (INDEPENDENT_AMBULATORY_CARE_PROVIDER_SITE_OTHER): Payer: Managed Care, Other (non HMO) | Admitting: Obstetrics and Gynecology

## 2017-04-10 VITALS — BP 117/87 | HR 120 | Ht 66.0 in | Wt 246.4 lb

## 2017-04-10 DIAGNOSIS — N920 Excessive and frequent menstruation with regular cycle: Secondary | ICD-10-CM | POA: Diagnosis not present

## 2017-04-10 DIAGNOSIS — E669 Obesity, unspecified: Secondary | ICD-10-CM

## 2017-04-10 DIAGNOSIS — N939 Abnormal uterine and vaginal bleeding, unspecified: Secondary | ICD-10-CM

## 2017-04-10 MED ORDER — MEDROXYPROGESTERONE ACETATE 10 MG PO TABS: 30.0000 mg | ORAL_TABLET | Freq: Every day | ORAL | 0 refills | Status: DC

## 2017-04-10 NOTE — Patient Instructions (Signed)
1.  Endometrial biopsy is obtained today 2.  Pelvic ultrasound is scheduled to assess for etiology of heavy bleeding 3.  Begin Provera 30 mg a day for the next 30 days 4.  CBC is ordered today 5.  Return in 2 weeks for follow-up   Endometrial Biopsy, Care After This sheet gives you information about how to care for yourself after your procedure. Your health care provider may also give you more specific instructions. If you have problems or questions, contact your health care provider. What can I expect after the procedure? After the procedure, it is common to have:  Mild cramping.  A small amount of vaginal bleeding for a few days. This is normal.  Follow these instructions at home:  Take over-the-counter and prescription medicines only as told by your health care provider.  Do not douche, use tampons, or have sexual intercourse until your health care provider approves.  Return to your normal activities as told by your health care provider. Ask your health care provider what activities are safe for you.  Follow instructions from your health care provider about any activity restrictions, such as restrictions on strenuous exercise or heavy lifting. Contact a health care provider if:  You have heavy bleeding, or bleed for longer than 2 days after the procedure.  You have bad smelling discharge from your vagina.  You have a fever or chills.  You have a burning sensation when urinating or you have difficulty urinating.  You have severe pain in your lower abdomen. Get help right away if:  You have severe cramps in your stomach or back.  You pass large blood clots.  Your bleeding increases.  You become weak or light-headed, or you pass out. Summary  After the procedure, it is common to have mild cramping and a small amount of vaginal bleeding for a few days.  Do not douche, use tampons, or have sexual intercourse until your health care provider approves.  Return to your  normal activities as told by your health care provider. Ask your health care provider what activities are safe for you. This information is not intended to replace advice given to you by your health care provider. Make sure you discuss any questions you have with your health care provider. Document Released: 11/27/2012 Document Revised: 02/23/2016 Document Reviewed: 02/23/2016 Elsevier Interactive Patient Education  2017 [employer]

## 2017-04-10 NOTE — Addendum Note (Signed)
Addended by: Marchelle FolksMILLER, Leidi Astle G on: 04/10/2017 01:07 PM   Modules accepted: Orders

## 2017-04-10 NOTE — Progress Notes (Signed)
GYN ENCOUNTER NOTE  Subjective:       Dana Durham is a 42 y.o. [phone] female is here for gynecologic evaluation of the following issues:  1.  Heavy bleeding for 17 days  Patient began heavy bleeding with clots over the past 17 days since 03/24/2017.  She is anxious. Blood sugars have not been optimally controlled to date. Bowel and bladder function are normal.   OB History  Gravida Para Term Preterm AB Living  5 2 1 1 3 1   SAB TAB Ectopic Multiple Live Births  3       2    # Outcome Date GA Lbr Len/2nd Weight Sex Delivery Anes PTL Lv  5 Preterm 2013        DEC     Complications: SIDS (sudden infant death syndrome)  4 Term 2004    F Vag-Spont   LIV  3 SAB         FD  2 SAB         FD  1 SAB         FD    Obstetric Comments  1st Menstrual Cycle:  15   1st Pregnancy:  19    Past Medical History:  Diagnosis Date  . Abnormal uterine bleeding 04/08/2014   endometrial bx- normal w/secrectoy tissue and no hyperplasia or carcinoma  . Anxiety and depression   . Cervix abnormality   . Depression   . Fracture of ulna    left  . Heavy periods 04/08/2014   start minipill for regulation of period. advise against estrogen at this time due to increased age and diastolic htn  . Increased BMI   . Joint pain   . MRSA (methicillin resistant Staphylococcus aureus) 11/2013   left arm  . Prediabetes   . PTSD (post-traumatic stress disorder)     Past Surgical History:  Procedure Laterality Date  . BREAST CYST ASPIRATION Left 2015   lt axillary drainage  . CHOLECYSTECTOMY  2006  . CYST EXCISION  1999   eyelid    Current Outpatient Medications on File Prior to Visit  Medication Sig Dispense Refill  . ALPRAZolam (XANAX) 1 MG tablet Take 1 mg by mouth 3 (three) times daily as needed for anxiety.    . Biotin 1 MG CAPS Take 1 tablet by mouth daily.    . Multiple Vitamin (MULTIVITAMIN) capsule Take 1 capsule by mouth daily.     No current facility-administered medications on file  prior to visit.     Allergies  Allergen Reactions  . Shellfish Allergy Anaphylaxis    Social History   Socioeconomic History  . Marital status: Married    Spouse name: Not on file  . Number of children: Not on file  . Years of education: Not on file  . Highest education level: Not on file  Social Needs  . Financial resource strain: Not on file  . Food insecurity - worry: Not on file  . Food insecurity - inability: Not on file  . Transportation needs - medical: Not on file  . Transportation needs - non-medical: Not on file  Occupational History  . Not on file  Tobacco Use  . Smoking status: Current Every Day Smoker    Packs/day: 1.00    Years: 19.00    Pack years: 19.00  . Smokeless tobacco: Never Used  Substance and Sexual Activity  . Alcohol use: No  . Drug use: No  . Sexual activity: Not Currently  Birth control/protection: Condom  Other Topics Concern  . Not on file  Social History Narrative  . Not on file    Family History  Problem Relation Age of Onset  . Breast cancer Maternal Aunt   . Cancer Maternal Aunt        breast/Aunt  . Cancer Cousin        breast/Maternal    The following portions of the patient's history were reviewed and updated as appropriate: allergies, current medications, past family history, past medical history, past social history, past surgical history and problem list.  Review of Systems Review of Systems -comprehensive review of systems is negative except for that noted in the HPI  Objective:   BP 117/87   Pulse (!) 120   Ht 5\' 6"  (1.676 m)   Wt 246 lb 6.4 oz (111.8 kg)   LMP 03/24/2016 (Exact Date)   BMI 39.77 kg/m  CONSTITUTIONAL: Well-developed, well-nourished female in no acute distress.  HENT:  Normocephalic, atraumatic.  NECK: Normal range of motion, supple, no masses.  Normal thyroid.  SKIN: Skin is warm and dry. No rash noted. Not diaphoretic. No erythema. No pallor. NEUROLGIC: Alert and oriented to person,  place, and time. PSYCHIATRIC: Normal mood and affect. Normal behavior. Normal judgment and thought content. CARDIOVASCULAR:Not Examined RESPIRATORY: Not Examined BREASTS: Not Examined ABDOMEN: Soft, non distended; Non tender.  No Organomegaly.  Large panniculus PELVIC:  External Genitalia: Normal; blood-tinged  BUS: Normal  Vagina: Normal; no lesions; burgundy blood within the office  Cervix: Normal; no cervical motion tenderness; no lesions  Uterus: Midplane uterus; size difficult to assess due to body habitus; nontender  Adnexa: Normal; nonpalpable nontender  RV: Normal external exam  Bladder: Nontender MUSCULOSKELETAL: Normal range of motion. No tenderness.  No cyanosis, clubbing, or edema.   PROCEDURE: Endometrial biopsy Endometrial Biopsy Procedure Note  Pre-operative Diagnosis: Menorrhagia  Post-operative Diagnosis: same  Procedure Details   Urine pregnancy test was not done.  The risks (including infection, bleeding, pain, and uterine perforation) and benefits of the procedure were explained to the patient and Verbal informed consent was obtained.  Antibiotic prophylaxis against endocarditis was not indicated.   The patient was placed in the dorsal lithotomy position.  Bimanual exam showed the uterus to be in the neutral position.  A Graves' speculum inserted in the vagina.  Endocervical curettage with a Kevorkian curette was not performed.   A sharp tenaculum was not applied to the anterior lip of the cervix for stabilization.  A sterile Milex 3 mm pipette was used to sound the uterus to a depth of 8cm.  A Mylex 3mm curette was used to sample the endometrium.  Sample was sent for pathologic examination.  Condition: Stable  Complications: None  Plan:  The patient was advised to call for any fever or for prolonged or severe pain or bleeding. She was advised to use OTC acetaminophen and OTC ibuprofen as needed for mild to moderate pain. She was advised to avoid vaginal  intercourse for 48 hours or until the bleeding has completely stopped.  Attending Physician Documentation: [name], MD   Assessment:   1. Abnormal uterine bleeding (AUB) - CBC with Differential/Platelet  2. Menorrhagia with regular cycle - CBC - US PELVIS (TRANSABDOMINAL ONLY); Future - US PELVIS TRANSVANGINAL NON-OB (TV ONLY); Future  3. Obesity (BMI 30-39.9)     Plan:   1.  Endometrial biopsy is done 2.  Pelvic ultrasound is ordered 3.  CBC is obtained today 4.  Begin Provera 30 mg a day for 30 days 5.  Return in 2 weeks for follow-up and further management planning  A total of 15 minutes were spent face-to-face with the patient during this encounter and over half of that time dealt with counseling and coordination of care.  Herold HarmsMartin A Defrancesco, MD  Note: This dictation was prepared with Dragon dictation along with smaller phrase technology. Any transcriptional errors that result from this process are unintentional.

## 2017-04-11 ENCOUNTER — Other Ambulatory Visit: Payer: Managed Care, Other (non HMO)

## 2017-04-11 LAB — CBC WITH DIFFERENTIAL/PLATELET
BASOS ABS: 0.1 10*3/uL (ref 0.0–0.2)
Basos: 0 %
EOS (ABSOLUTE): 0.4 10*3/uL (ref 0.0–0.4)
Eos: 3 %
HEMOGLOBIN: 13.9 g/dL (ref 11.1–15.9)
Hematocrit: 41.3 % (ref 34.0–46.6)
IMMATURE GRANS (ABS): 0.1 10*3/uL (ref 0.0–0.1)
Immature Granulocytes: 1 %
LYMPHS: 30 %
Lymphocytes Absolute: 3.5 10*3/uL — ABNORMAL HIGH (ref 0.7–3.1)
MCH: 28.5 pg (ref 26.6–33.0)
MCHC: 33.7 g/dL (ref 31.5–35.7)
MCV: 85 fL (ref 79–97)
Monocytes Absolute: 0.5 10*3/uL (ref 0.1–0.9)
Monocytes: 4 %
NEUTROS ABS: 7.2 10*3/uL — AB (ref 1.4–7.0)
Neutrophils: 62 %
Platelets: 431 10*3/uL — ABNORMAL HIGH (ref 150–379)
RBC: 4.88 x10E6/uL (ref 3.77–5.28)
RDW: 14.3 % (ref 12.3–15.4)
WBC: 11.7 10*3/uL — ABNORMAL HIGH (ref 3.4–10.8)

## 2017-04-12 ENCOUNTER — Ambulatory Visit (INDEPENDENT_AMBULATORY_CARE_PROVIDER_SITE_OTHER): Payer: Managed Care, Other (non HMO)

## 2017-04-12 DIAGNOSIS — N920 Excessive and frequent menstruation with regular cycle: Secondary | ICD-10-CM | POA: Diagnosis not present

## 2017-04-14 LAB — PATHOLOGY

## 2017-04-25 ENCOUNTER — Encounter: Payer: Self-pay | Admitting: Obstetrics and Gynecology

## 2017-04-25 ENCOUNTER — Ambulatory Visit (INDEPENDENT_AMBULATORY_CARE_PROVIDER_SITE_OTHER): Payer: Managed Care, Other (non HMO) | Admitting: Obstetrics and Gynecology

## 2017-04-25 VITALS — BP 137/86 | HR 120 | Ht 66.0 in | Wt 246.3 lb

## 2017-04-25 DIAGNOSIS — N939 Abnormal uterine and vaginal bleeding, unspecified: Secondary | ICD-10-CM | POA: Diagnosis not present

## 2017-04-25 NOTE — Progress Notes (Signed)
Chief complaint: 1.  Menorrhagia 2.  Follow-up on endometrial biopsy and pelvic ultrasound  Status post endometrial biopsy and pelvic ultrasound.  Currently taking Provera 30 mg a day with final complete resolution of bleeding 4 days ago.  Endometrial biopsy 04/10/2017 results: Diagnosis:  ENDOMETRIUM, POLYP:  SECRETORY ENDOMETRIUM WITH MODERATE BREAKDOWN CHANGES.  NO HYPERPLASIA OR CARCINOMA.  XDB/04/13/2017  Pelvic ultrasound results 04/12/2017:  ULTRASOUND REPORT  Location: [facility] Date of Service:  04/12/2017   Indications: Menorrhagia Findings:  The uterus measures 6.7 x 5.4 x 4.7 cm. Echo texture is heterogenous with evidence of a focal mass. Within the uterus is a single suspected fibroid measuring: Fibroid 1: Anterior, Intramural, LUS, 1.7 x 1.7 x 1.9 cm The Endometrium measures 5.4 mm.  Neither ovary is visualized due to overlying bowel gas. Survey of the adnexa demonstrates no adnexal masses. There is no free fluid in the cul de sac.  Impression: 1. Anteflexed uterus appears of normal size and contour. 2. The endometrium measures 5.4 mm. 3. An anterior, intramural fibroid measuring 1.7 x 1.7 x 1.9 cm is noted within the LUS of the uterus. 4. Neither ovary was visualized due to overlying bowel gas.  Recommendations: 1.Clinical correlation with the patient's History and Physical Exam.   Kari BaarsJill Long, RDMS Herold HarmsMartin A Shakeitha Umbaugh, MD  Past medical history, past surgical history, problem list, medications, and allergies are reviewed  OBJECTIVE: BP 137/86   Pulse (!) 120   Ht 5\' 6"  (1.676 m)   Wt 246 lb 4.8 oz (111.7 kg)   LMP  (LMP Unknown)   BMI 39.75 kg/m  Physical exam-deferred  ASSESSMENT: 1.  Menorrhagia secondary to endometrial polyp 2.  Uterine fibroid 3.  Ongoing Provera suppression of bleeding 30 mg a day  PLAN: 1.  Complete course of Provera over total of 30 days. 2.  Continue monitoring menstrual calendar bleeding 3.   Return in 4 months for follow-up on bleeding and further management planning 4.  Patient understands that if abnormal bleeding recurs, she may return for consideration of hysteroscopy/D&C to resolve the menorrhagia issue.  A total of 15 minutes were spent face-to-face with the patient during this encounter and over half of that time dealt with counseling and coordination of care.  Herold HarmsMartin A Constant [place], MD  Note: This dictation was prepared with Dragon dictation along with smaller phrase technology. Any transcriptional errors that result from this process are unintentional.

## 2017-04-25 NOTE — Patient Instructions (Signed)
1.  Continue Provera 30 mg a day through the middle of March as prescribed. 2.  Maintain menstrual calendar monitoring regarding abnormal uterine bleeding 3.  Return in 4 months for follow-up. 4.  If bleeding becomes very erratic and heavy following discontinuation of Provera, come in sooner for consideration of D&C.  Endometrial biopsy showed benign endometrial polyp and secretory endometrium Pelvic ultrasound showed single uterine fibroid.

## 2017-07-10 ENCOUNTER — Telehealth: Payer: Self-pay | Admitting: *Deleted

## 2017-07-10 ENCOUNTER — Ambulatory Visit (INDEPENDENT_AMBULATORY_CARE_PROVIDER_SITE_OTHER): Payer: Managed Care, Other (non HMO) | Admitting: Obstetrics and Gynecology

## 2017-07-10 ENCOUNTER — Encounter: Payer: Self-pay | Admitting: Obstetrics and Gynecology

## 2017-07-10 VITALS — BP 140/98 | HR 105 | Ht 66.0 in | Wt 244.4 lb

## 2017-07-10 DIAGNOSIS — R300 Dysuria: Secondary | ICD-10-CM

## 2017-07-10 DIAGNOSIS — Z202 Contact with and (suspected) exposure to infections with a predominantly sexual mode of transmission: Secondary | ICD-10-CM | POA: Diagnosis not present

## 2017-07-10 LAB — POCT URINALYSIS DIPSTICK
Bilirubin, UA: NEGATIVE
Blood, UA: NEGATIVE
Glucose, UA: NEGATIVE
LEUKOCYTES UA: NEGATIVE
NITRITE UA: NEGATIVE
ODOR: NEGATIVE
PH UA: 6 (ref 5.0–8.0)
PROTEIN UA: POSITIVE — AB
Spec Grav, UA: 1.015 (ref 1.010–1.025)
UROBILINOGEN UA: 0.2 U/dL

## 2017-07-10 MED ORDER — FLUCONAZOLE 150 MG PO TABS: 150.0000 mg | ORAL_TABLET | Freq: Every day | ORAL | 0 refills | Status: AC

## 2017-07-10 NOTE — Patient Instructions (Signed)
1.  Maintain menstrual calendar monitoring for any abnormal uterine bleeding 2.  STD screen is obtained today and results will be made available 3.  Return in July 2019 for follow-up on history of abnormal uterine bleeding

## 2017-07-10 NOTE — Progress Notes (Signed)
Chief complaint: 1.  Vulvar mass 2.  STD exposure 3.  History of abnormal uterine bleeding  Patient has noted a lesion at her introitus recently.  It is not painful.  She is not experiencing any significant vaginal discharge or vaginal malodor.  She is desiring to be screened for STDs because of concerns about her former husband's fidelity. Patient has been monitoring her menstrual cycles after having completed 30 days of Provera therapy for menorrhagia management.  She has had only one menses in mid April lasting approximately 4 days since her discontinuation of Provera.  She is to return in several months for follow-up on her abnormal uterine bleeding  OBJECTIVE: BP (!) 140/98   Pulse (!) 105   Ht  (1.676 m)   Wt 244 lb 6.4 oz (110.9 kg)   LMP 06/04/2017 (Exact Date)   BMI 39.45 kg/m  Pleasant well-appearing female no acute distress.  Alert and oriented. Abdomen: Soft, nontender; moderate pannus without palpable masses Pelvic exam: External genitalia-normal vulva; introitus notable for hymeneal skin tags, symmetric (palpable abnormality noted by patient), nontender  BUS-normal Vagina-normal estrogen effect; no significant discharge Cervix-normal; prolapsed to within 4 centimeters of introitus; mild cervical motion tenderness Uterus-midplane, size difficult to assess due to body habitus, mobile, slightly tender 1/4 Adnexa-nonpalpable nontender Rectovaginal-normal external exam  ASSESSMENT: 1.  Vulvar mass-hymeneal skin tags (normal) 2.  STD exposure 3.  History of abnormal uterine bleeding, last menses April 2019 lasting 4 days; off Provera  PLAN: 1.  Reassurance is given regarding skin tags at the introitus 2.  GC/Chlamydia testing is obtained 3.  Maintain menstrual calendar monitoring 4.  Return in July 2019 for follow-up on abnormal uterine bleeding  A total of 15 minutes were spent face-to-face with the patient during this encounter and over half of that time dealt  with counseling and coordination of care.   [name], MD  Note: This dictation was prepared with Dragon dictation along with smaller phrase technology. Any transcriptional errors that result from this process are unintentional.

## 2017-07-10 NOTE — Telephone Encounter (Signed)
Pt aware diflucan erx. If no better in 7 days to contact the office.

## 2017-07-10 NOTE — Telephone Encounter (Signed)
Patient was seen in the office today and would like something called in for a yeast infection. She used OTC medication and it is not working. Her pharmacy is [facility] on [address]. Please advise. Thank you

## 2017-07-12 LAB — URINE CULTURE: Organism ID, Bacteria: NO GROWTH

## 2017-07-12 LAB — CHLAMYDIA/GONOCOCCUS/TRICHOMONAS, NAA
CHLAMYDIA BY NAA: NEGATIVE
GONOCOCCUS BY NAA: NEGATIVE
Trich vag by NAA: NEGATIVE

## 2017-09-11 ENCOUNTER — Encounter: Payer: Managed Care, Other (non HMO) | Admitting: Obstetrics and Gynecology

## 2020-07-20 ENCOUNTER — Other Ambulatory Visit: Payer: Self-pay | Admitting: Family Medicine

## 2020-07-20 DIAGNOSIS — Z1231 Encounter for screening mammogram for malignant neoplasm of breast: Secondary | ICD-10-CM

## 2020-08-11 ENCOUNTER — Other Ambulatory Visit: Payer: Self-pay

## 2020-08-11 ENCOUNTER — Ambulatory Visit
Admission: RE | Admit: 2020-08-11 | Discharge: 2020-08-11 | Disposition: A | Discharge status: Home / Self Care | Payer: Managed Care, Other (non HMO) | Source: Ambulatory Visit | Attending: Family Medicine | Admitting: Family Medicine

## 2020-08-11 DIAGNOSIS — Z1231 Encounter for screening mammogram for malignant neoplasm of breast: Secondary | ICD-10-CM | POA: Insufficient documentation

## 2020-12-01 ENCOUNTER — Other Ambulatory Visit: Payer: Self-pay | Admitting: Family Medicine

## 2020-12-03 ENCOUNTER — Other Ambulatory Visit: Payer: Self-pay | Admitting: Family Medicine

## 2020-12-03 DIAGNOSIS — N950 Postmenopausal bleeding: Secondary | ICD-10-CM

## 2020-12-07 ENCOUNTER — Ambulatory Visit
Admission: RE | Admit: 2020-12-07 | Discharge: 2020-12-07 | Disposition: A | Discharge status: Home / Self Care | Payer: Managed Care, Other (non HMO) | Source: Ambulatory Visit | Attending: Family Medicine | Admitting: Family Medicine

## 2020-12-07 DIAGNOSIS — N950 Postmenopausal bleeding: Secondary | ICD-10-CM

## 2021-03-23 ENCOUNTER — Other Ambulatory Visit: Payer: Self-pay | Admitting: Obstetrics and Gynecology

## 2021-03-23 DIAGNOSIS — N858 Other specified noninflammatory disorders of uterus: Secondary | ICD-10-CM

## 2021-04-05 ENCOUNTER — Ambulatory Visit
Admission: RE | Admit: 2021-04-05 | Discharge: 2021-04-05 | Disposition: A | Discharge status: Home / Self Care | Payer: Managed Care, Other (non HMO) | Source: Ambulatory Visit | Attending: Obstetrics and Gynecology | Admitting: Obstetrics and Gynecology

## 2021-04-05 DIAGNOSIS — N858 Other specified noninflammatory disorders of uterus: Secondary | ICD-10-CM

## 2021-04-05 MED ORDER — GADOBENATE DIMEGLUMINE 529 MG/ML IV SOLN
13.0000 mL | Freq: Once | INTRAVENOUS | Status: AC | PRN
  Administered 2021-04-05: 13 mL via INTRAVENOUS

## 2021-05-04 ENCOUNTER — Emergency Department
Admission: EM | Admit: 2021-05-04 | Discharge: 2021-05-04 | Disposition: A | Discharge status: Home / Self Care | Payer: Managed Care, Other (non HMO) | Attending: Emergency Medicine | Admitting: Emergency Medicine

## 2021-05-04 ENCOUNTER — Other Ambulatory Visit: Payer: Self-pay

## 2021-05-04 DIAGNOSIS — M791 Myalgia, unspecified site: Secondary | ICD-10-CM | POA: Insufficient documentation

## 2021-05-04 DIAGNOSIS — R197 Diarrhea, unspecified: Secondary | ICD-10-CM | POA: Diagnosis not present

## 2021-05-04 DIAGNOSIS — R112 Nausea with vomiting, unspecified: Secondary | ICD-10-CM | POA: Insufficient documentation

## 2021-05-04 DIAGNOSIS — Z5321 Procedure and treatment not carried out due to patient leaving prior to being seen by health care provider: Secondary | ICD-10-CM | POA: Diagnosis not present

## 2021-05-04 LAB — CBC
HCT: 44.7 % (ref 36.0–46.0)
Hemoglobin: 14.3 g/dL (ref 12.0–15.0)
MCH: 27.3 pg (ref 26.0–34.0)
MCHC: 32 g/dL (ref 30.0–36.0)
MCV: 85.5 fL (ref 80.0–100.0)
Platelets: 274 10*3/uL (ref 150–400)
RBC: 5.23 MIL/uL — ABNORMAL HIGH (ref 3.87–5.11)
RDW: 14.8 % (ref 11.5–15.5)
WBC: 11.8 10*3/uL — ABNORMAL HIGH (ref 4.0–10.5)
nRBC: 0 % (ref 0.0–0.2)

## 2021-05-04 NOTE — ED Notes (Signed)
No answer when called several times form lobby 

## 2021-05-04 NOTE — ED Notes (Signed)
No answer in lobby when called for lab redraw.  Pt cannot be visualized in lobby ?

## 2021-05-04 NOTE — ED Triage Notes (Signed)
Pt presents via EMS with complaints of N/V starting this AM. She notes taking compazine & zofran PTA without improvement. She notes her emesis is yellow in color or the food contents she has recently consumed. PTA EMS provided zofran which has improved some of her sx. Denies SOB or CP.  ?

## 2021-05-04 NOTE — ED Notes (Signed)
No answer when called several times from lobby 

## 2021-05-04 NOTE — ED Triage Notes (Signed)
EMS brings pt in from home, to lobby via w/c with no distress noted for c/o N/V/D, body aches since this am ?

## 2021-11-17 DIAGNOSIS — N939 Abnormal uterine and vaginal bleeding, unspecified: Secondary | ICD-10-CM | POA: Diagnosis not present

## 2022-05-11 DIAGNOSIS — L659 Nonscarring hair loss, unspecified: Secondary | ICD-10-CM | POA: Diagnosis not present

## 2022-05-11 DIAGNOSIS — E7800 Pure hypercholesterolemia, unspecified: Secondary | ICD-10-CM | POA: Diagnosis not present

## 2022-05-11 DIAGNOSIS — F330 Major depressive disorder, recurrent, mild: Secondary | ICD-10-CM | POA: Diagnosis not present

## 2022-05-11 DIAGNOSIS — E1169 Type 2 diabetes mellitus with other specified complication: Secondary | ICD-10-CM | POA: Diagnosis not present

## 2022-05-11 DIAGNOSIS — I10 Essential (primary) hypertension: Secondary | ICD-10-CM | POA: Diagnosis not present

## 2022-05-29 ENCOUNTER — Other Ambulatory Visit: Payer: Self-pay | Admitting: Family Medicine

## 2022-05-29 DIAGNOSIS — Z1231 Encounter for screening mammogram for malignant neoplasm of breast: Secondary | ICD-10-CM

## 2022-06-16 ENCOUNTER — Ambulatory Visit
Admission: RE | Admit: 2022-06-16 | Discharge: 2022-06-16 | Disposition: A | Discharge status: Home / Self Care | Payer: Self-pay | Source: Ambulatory Visit | Attending: Family Medicine | Admitting: Family Medicine

## 2022-06-16 DIAGNOSIS — Z1231 Encounter for screening mammogram for malignant neoplasm of breast: Secondary | ICD-10-CM | POA: Insufficient documentation

## 2022-06-20 DIAGNOSIS — F419 Anxiety disorder, unspecified: Secondary | ICD-10-CM | POA: Diagnosis not present

## 2022-06-20 DIAGNOSIS — F330 Major depressive disorder, recurrent, mild: Secondary | ICD-10-CM | POA: Diagnosis not present

## 2022-06-20 DIAGNOSIS — J302 Other seasonal allergic rhinitis: Secondary | ICD-10-CM | POA: Diagnosis not present
